# Patient Record
Sex: Male | Born: 1945 | Race: White | Hispanic: No | Marital: Married
Health system: Midwestern US, Academic
[De-identification: ages and names within clinical notes are randomized; demographics above are authoritative.]

---

## 2016-10-19 MED ORDER — SPIRONOLACTONE 25 MG PO TAB
ORAL_TABLET | Freq: Every day | ORAL | 3 refills | 90.00000 days | Status: AC
Start: 2016-10-19 — End: ?

## 2016-11-23 MED ORDER — CARVEDILOL 12.5 MG PO TAB
12.5 mg | ORAL_TABLET | Freq: Two times a day (BID) | ORAL | 1 refills | 90.00000 days | Status: AC
Start: 2016-11-23 — End: ?

## 2017-01-17 ENCOUNTER — Encounter: Admit: 2017-01-17 | Discharge: 2017-01-17

## 2017-04-20 ENCOUNTER — Encounter: Admit: 2017-04-20 | Discharge: 2017-04-20

## 2017-04-20 NOTE — Telephone Encounter
Transferred on 04/20/17.

## 2018-02-13 ENCOUNTER — Encounter: Admit: 2018-02-13 | Discharge: 2018-02-13

## 2018-04-21 ENCOUNTER — Encounter: Admit: 2018-04-21 | Discharge: 2018-04-21

## 2018-08-03 IMAGING — CR CHEST
4 series · 4 of 4 positions shown · non-contrast
Comparison: none

[shoulder external]
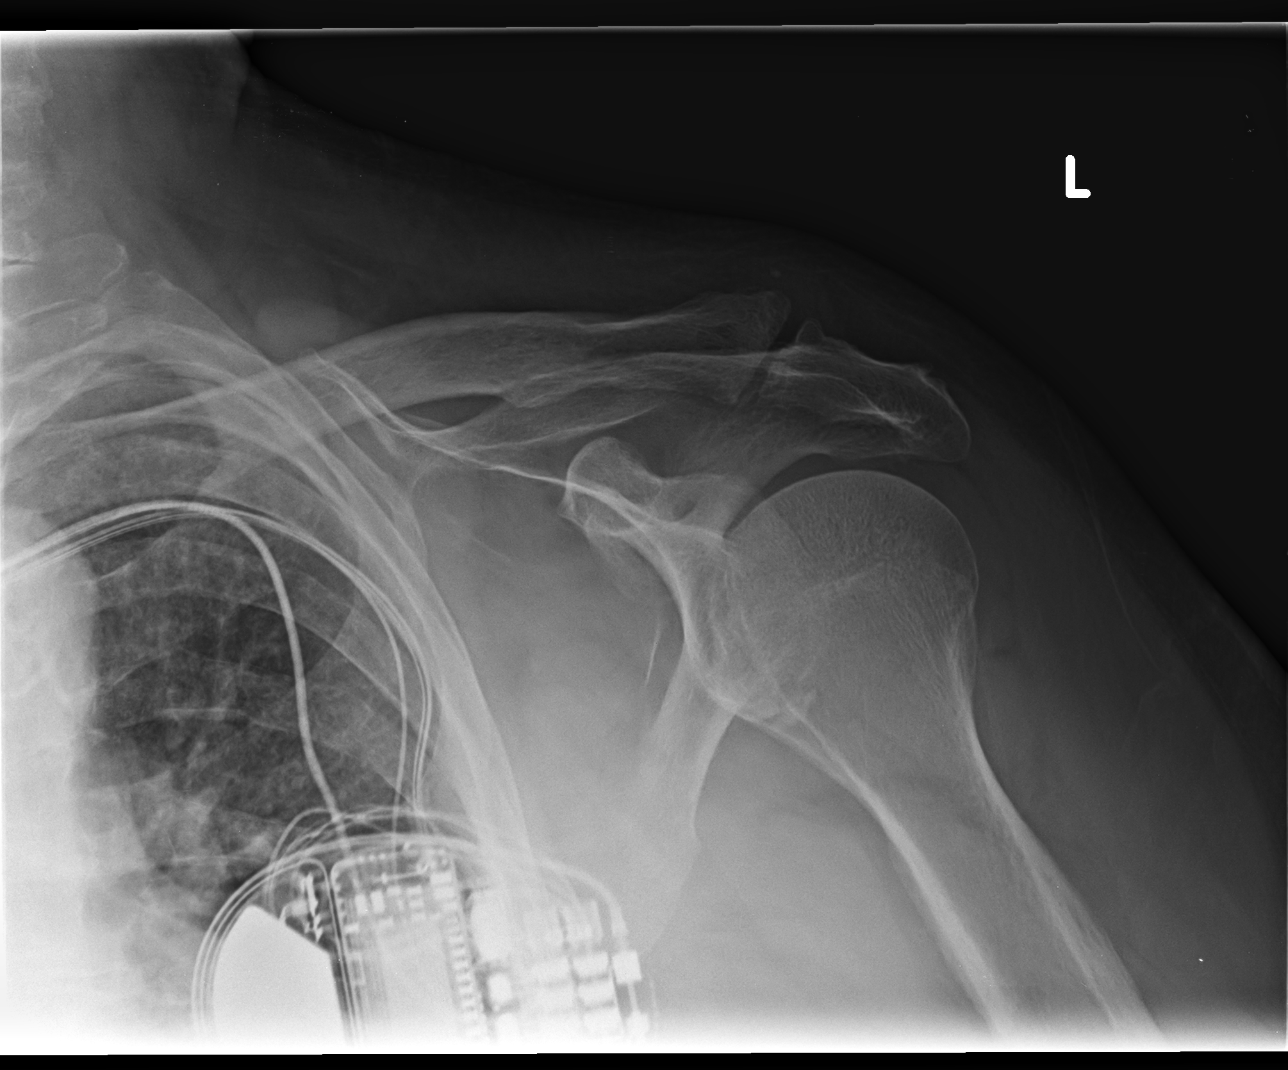

[shoulder internal]
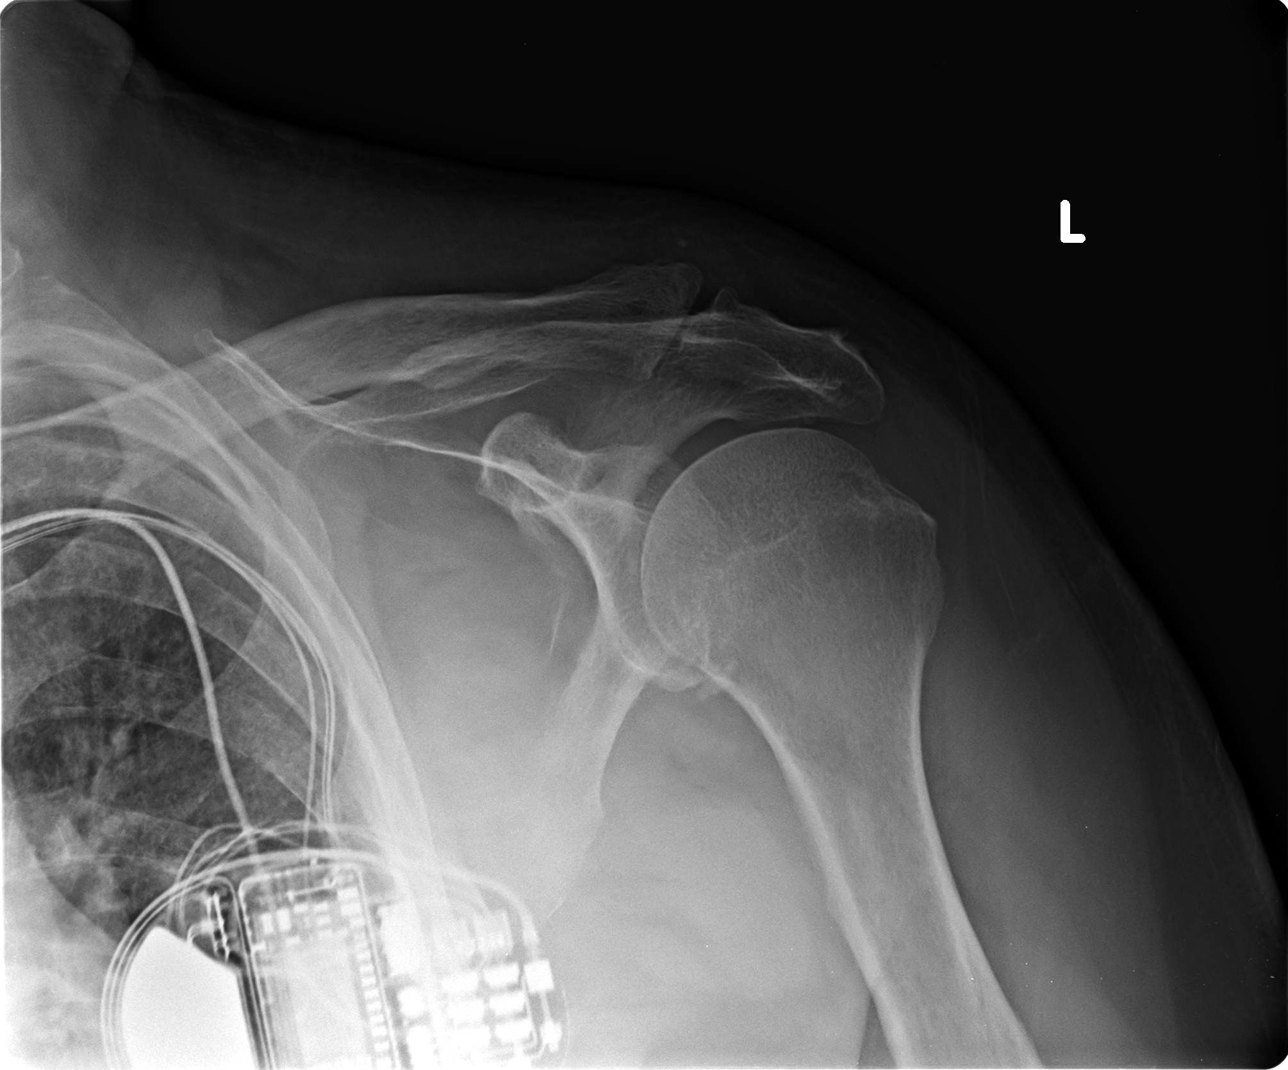

[shoulder y-view]
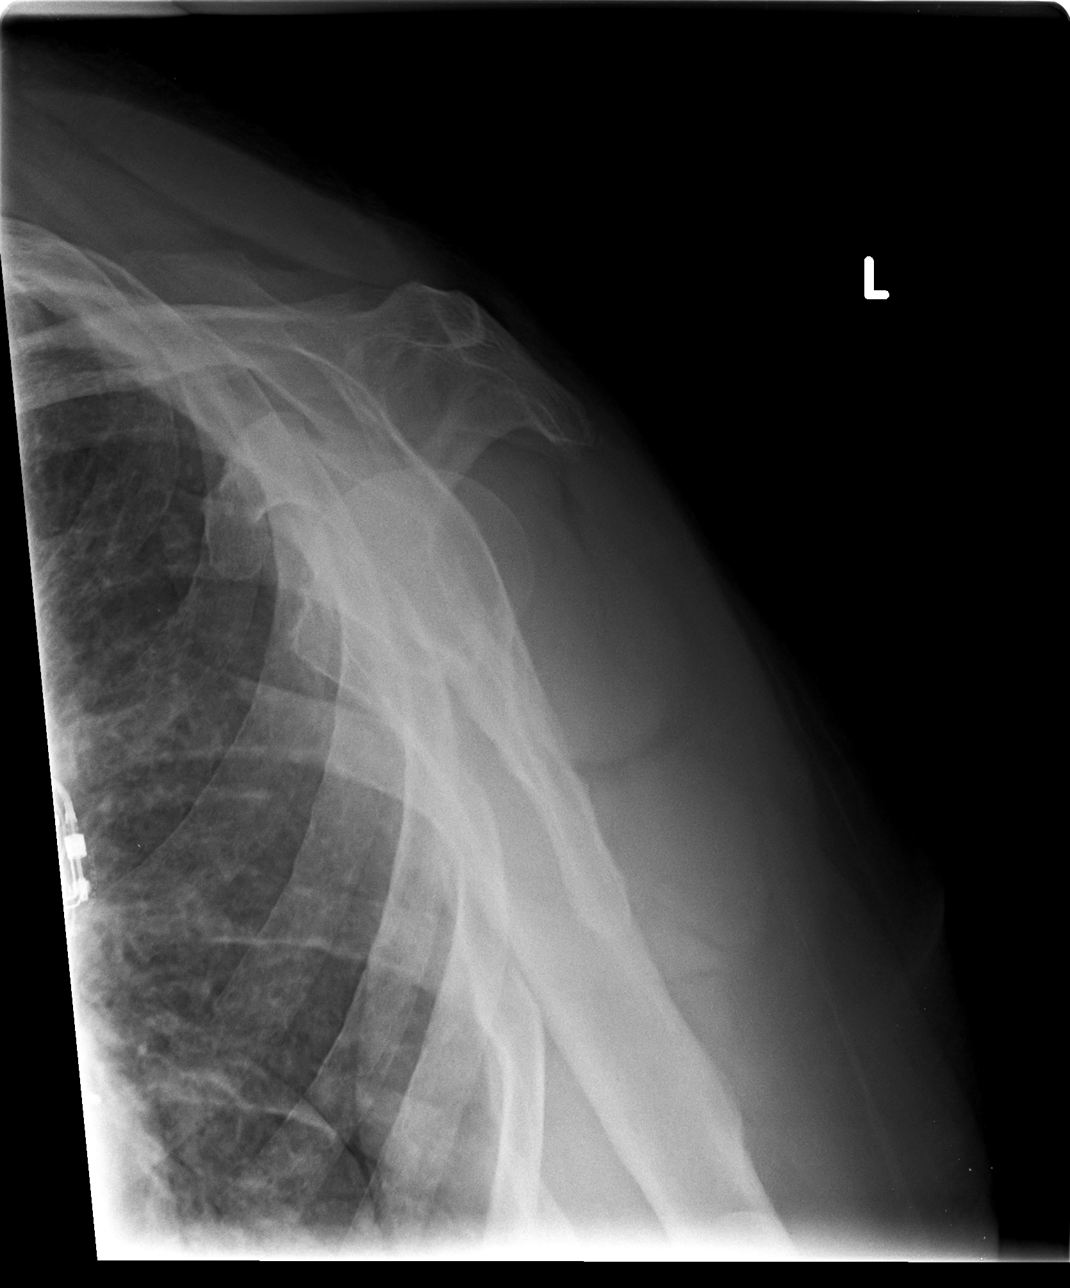

[shoulder axillary]
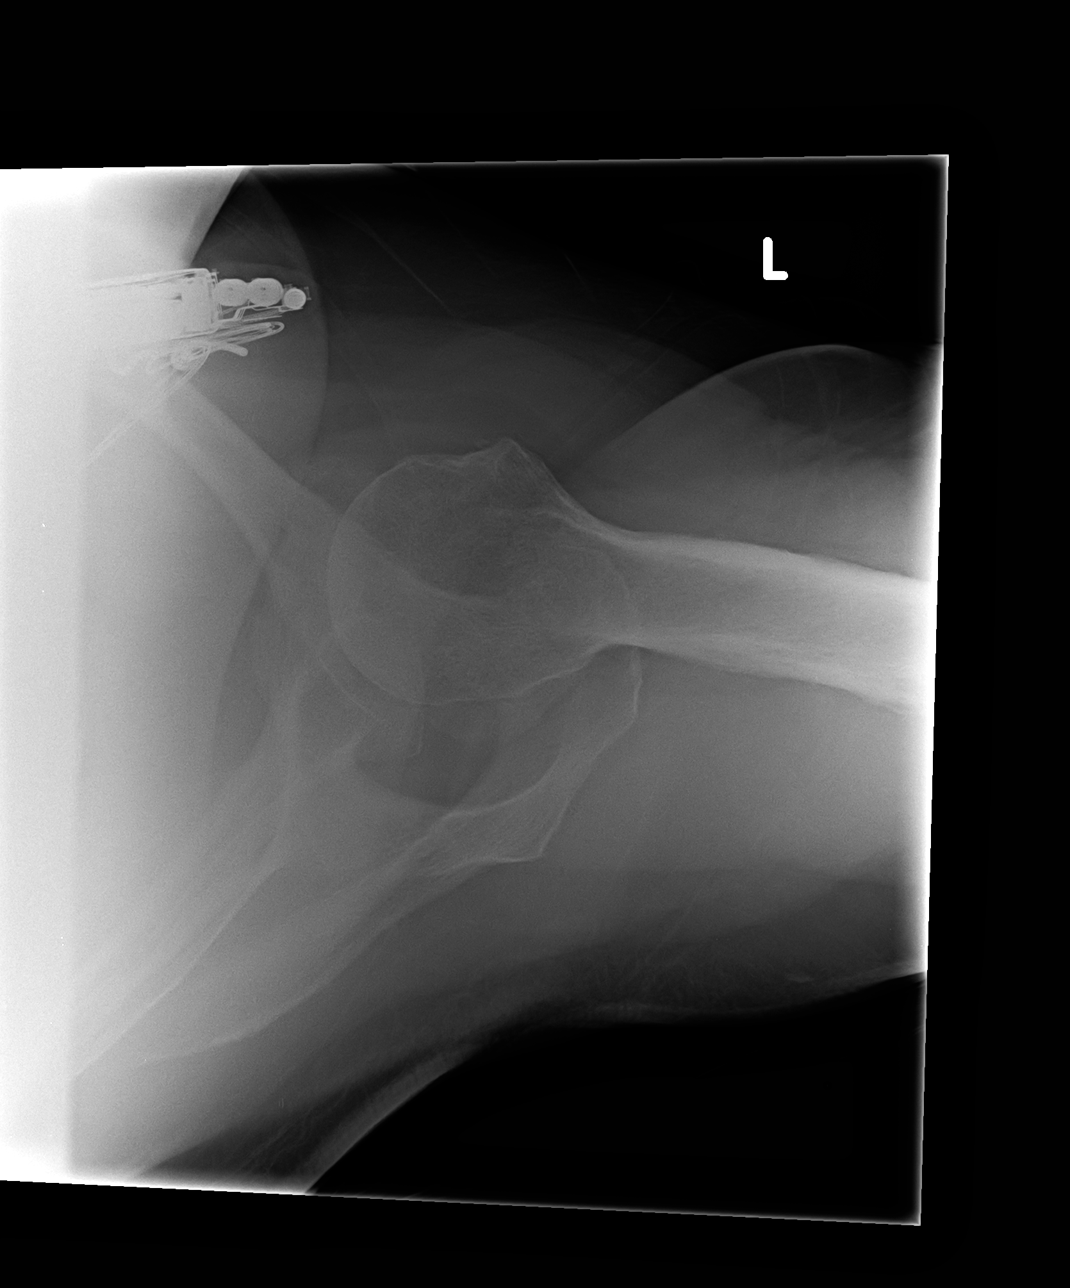

[4 of 4 positions shown; findings below may reference images not displayed]

DIAGNOSTIC STUDIES

EXAM

RADIOLOGICAL EXAMINATION RIGHT SHOULDER; COMPLETE, MINIMUM 2 VIEWS CPT 09292

INDICATION

Right shoulder pain.

TECHNIQUE

4 views of the right shoulder were acquired.

COMPARISONS

No prior exams available.

FINDINGS

There is no evidence of fracture or dislocation. The articulations appear to be notable for severe
acromioclavicular degenerative changes. Glenohumeral joint demonstrates moderate degenerative
changes.. The soft tissues are normal.

IMPRESSION

Moderate to severe degenerative changes of the shoulder.

## 2020-04-07 ENCOUNTER — Encounter: Admit: 2020-04-07 | Discharge: 2020-04-07

## 2023-12-31 ENCOUNTER — Encounter: Admit: 2023-12-31 | Discharge: 2023-12-31
